# Patient Record
Sex: Male | Born: 1980 | Race: Black or African American | Hispanic: No | Marital: Single | Smoking: Current every day smoker
Health system: Southern US, Community
[De-identification: ages and names within clinical notes are randomized; demographics above are authoritative.]

## PROBLEM LIST (undated history)

## (undated) HISTORY — PX: OTHER SURGICAL HISTORY: SHX169

---

## 2005-09-25 ENCOUNTER — Emergency Department: Payer: Self-pay | Admitting: Emergency Medicine

## 2008-07-09 ENCOUNTER — Emergency Department: Payer: Self-pay | Admitting: Emergency Medicine

## 2011-10-24 ENCOUNTER — Emergency Department: Payer: Self-pay | Admitting: Emergency Medicine

## 2011-11-18 ENCOUNTER — Emergency Department: Payer: Self-pay | Admitting: Emergency Medicine

## 2013-03-22 ENCOUNTER — Emergency Department: Payer: Self-pay | Admitting: Emergency Medicine

## 2013-06-30 ENCOUNTER — Emergency Department: Payer: Self-pay | Admitting: Emergency Medicine

## 2013-07-11 ENCOUNTER — Emergency Department: Payer: Self-pay | Admitting: Emergency Medicine
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
## 2020-01-20 ENCOUNTER — Emergency Department
Admission: EM | Admit: 2020-01-20 | Discharge: 2020-01-20 | Disposition: A | Discharge status: Home / Self Care | Payer: Self-pay | Attending: Emergency Medicine | Admitting: Emergency Medicine

## 2020-01-20 ENCOUNTER — Encounter: Payer: Self-pay | Admitting: Emergency Medicine

## 2020-01-20 ENCOUNTER — Other Ambulatory Visit: Payer: Self-pay

## 2020-01-20 DIAGNOSIS — F17210 Nicotine dependence, cigarettes, uncomplicated: Secondary | ICD-10-CM | POA: Insufficient documentation

## 2020-01-20 DIAGNOSIS — M7702 Medial epicondylitis, left elbow: Secondary | ICD-10-CM | POA: Insufficient documentation

## 2020-01-20 MED ORDER — PREDNISONE 10 MG (21) PO TBPK: ORAL_TABLET | ORAL | 0 refills | Status: DC

## 2020-01-20 NOTE — Discharge Instructions (Addendum)
Take the steroids as instructed.  Follow-up with orthopedics if not improving with this medication.  If you had relief with steroids, please use ibuprofen when the area flares.  Return as needed.

## 2020-01-20 NOTE — ED Triage Notes (Signed)
Pt presents to ED via POV with c/o L shoulder pain x 2-3 weeks. Pt denies known injury at this time. 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

## 2020-01-20 NOTE — ED Notes (Signed)
See triage note- pt here with L sided shoulder pain x2-3 weeks. Pt denies injury. Pain unrelieved with rest.

## 2020-01-20 NOTE — ED Provider Notes (Signed)
[facility] Emergency Department Provider Note  ____________________________________________   First MD Initiated Contact with Patient 01/20/20 1240     (approximate)  I have reviewed the triage vital signs and the nursing notes.   HISTORY  Chief Complaint Shoulder Pain    HPI Tony Rosales is a 39 y.o. male presents emergency department complaining of left shoulder and elbow pain for 2 to 3 weeks.  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  States sometimes his grip gets weakened.  Took some Tylenol which did help with the pain but the symptoms returned after the medication wore off.  He denies any fever or chills.   No known injury   History reviewed. No pertinent past medical history.  There are no problems to display for this patient.   Past Surgical History:  Procedure Laterality Date  . Arm Surgery Right     Prior to Admission medications   Medication Sig Start Date End Date Taking? Authorizing Provider  predniSONE (STERAPRED UNI-PAK 21 TAB) 10 MG (21) TBPK tablet Take 6 pills on day one then decrease by 1 pill each day 01/20/20   Faythe Ghee, PA-C    Allergies Patient has no known allergies.  No family history on file.  Social History Social History   Tobacco Use  . Smoking status: Current Every Day Smoker    Types: Cigarettes  . Smokeless tobacco: Never Used  Substance Use Topics  . Alcohol use: Yes    Comment: Socially  . Drug use: Not Currently    Review of Systems  Constitutional: No fever/chills Eyes: No visual changes. ENT: No sore throat. Respiratory: Denies cough Genitourinary: Negative for dysuria. Musculoskeletal: Negative for back pain.  Positive for left shoulder/elbow pain Skin: Negative for rash. Psychiatric: no mood changes,     ____________________________________________   PHYSICAL EXAM:  VITAL  SIGNS: ED Triage Vitals  Enc Vitals Group     BP 01/20/20 1214 117/70     Pulse Rate 01/20/20 1214 77     Resp 01/20/20 1214 16     Temp 01/20/20 1214 98.3 F (36.8 C)     Temp Source 01/20/20 1214 Oral     SpO2 01/20/20 1214 99 %     Weight 01/20/20 1215 195 lb (88.5 kg)     Height 01/20/20 1215 5\' 11"  (1.803 m)     Head Circumference --      Peak Flow --      Pain Score 01/20/20 1223 6     Pain Loc --      Pain Edu? --      Excl. in GC? --     Constitutional: Alert and oriented. Well appearing and in no acute distress. Eyes: Conjunctivae are normal.  Head: Atraumatic. Nose: No congestion/rhinnorhea. Mouth/Throat: Mucous membranes are moist.   Neck:  supple no lymphadenopathy noted Cardiovascular: Normal rate, regular rhythm. Respiratory: Normal respiratory effort.  No retractions,  GU: deferred Musculoskeletal: FROM all extremities, warm and well perfused, area along the ulnar nerve is very tender to palpation, grips are equal bilaterally, C-spine and left shoulder are nontender.  The bicep muscle is slightly tender. Neurologic:  Normal speech and language.  Skin:  Skin is warm, dry and intact. No rash noted. Psychiatric: Mood and affect are normal. Speech and behavior are normal.  ____________________________________________   LABS (all labs ordered are listed, but only abnormal  results are displayed)  Labs Reviewed - No data to display ____________________________________________   ____________________________________________  RADIOLOGY    ____________________________________________   PROCEDURES  Procedure(s) performed: No  Procedures    ____________________________________________   INITIAL IMPRESSION / ASSESSMENT AND PLAN / ED COURSE  Pertinent labs & imaging results that were available during my care of the patient were reviewed by me and considered in my medical decision making (see chart for details).   Patient is 39 year old male presents  emergency department with left elbow pain with concerns of an overuse type injury.  See HPI.  Physical exam shows no purulent noted to be tender.  Explained findings to the patient.  Told him this is the medial epicondylitis.  However the ulnar nerve could become trapped and this could cause additional problems.  He was started on a steroid pack.  Follow-up with orthopedics.  Return if worsening.  Discharged in stable condition.     Tony Rosales was evaluated in Emergency Department on 01/20/2020 for the symptoms described in the history of present illness. He was evaluated in the context of the global COVID-19 pandemic, which necessitated consideration that the patient might be at risk for infection with the SARS-CoV-2 virus that causes COVID-19. Institutional protocols and algorithms that pertain to the evaluation of patients at risk for COVID-19 are in a state of rapid change based on information released by regulatory bodies including the CDC and federal and state organizations. These policies and algorithms were followed during the patient's care in the ED.    As part of my medical decision making, I reviewed the following data within the electronic MEDICAL RECORD NUMBER Nursing notes reviewed and incorporated, Old chart reviewed, Notes from prior ED visits and [place] Controlled Substance Database  ____________________________________________   FINAL CLINICAL IMPRESSION(S) / ED DIAGNOSES  Final diagnoses:  Medial epicondylitis of left elbow      NEW MEDICATIONS STARTED DURING THIS VISIT:  New Prescriptions   PREDNISONE (STERAPRED UNI-PAK 21 TAB) 10 MG (21) TBPK TABLET    Take 6 pills on day one then decrease by 1 pill each day     Note:  This document was prepared using Dragon voice recognition software and may include unintentional dictation errors.    Faythe Ghee, PA-C 01/20/20 1358    Shaune Pollack, MD 01/20/20 [phone]

## 2023-02-08 ENCOUNTER — Ambulatory Visit
Admission: EM | Admit: 2023-02-08 | Discharge: 2023-02-08 | Disposition: A | Discharge status: Home / Self Care | Payer: Self-pay | Attending: Physician Assistant | Admitting: Physician Assistant

## 2023-02-08 ENCOUNTER — Encounter: Payer: Self-pay | Admitting: *Deleted

## 2023-02-08 DIAGNOSIS — M545 Low back pain, unspecified: Secondary | ICD-10-CM

## 2023-02-08 DIAGNOSIS — R310 Gross hematuria: Secondary | ICD-10-CM

## 2023-02-08 LAB — URINALYSIS, W/ REFLEX TO CULTURE (INFECTION SUSPECTED)
Bilirubin Urine: NEGATIVE
Glucose, UA: NEGATIVE mg/dL
Hgb urine dipstick: NEGATIVE
Ketones, ur: NEGATIVE mg/dL
Leukocytes,Ua: NEGATIVE
Nitrite: NEGATIVE
Protein, ur: NEGATIVE mg/dL
Specific Gravity, Urine: 1.025 (ref 1.005–1.030)
pH: 7 (ref 5.0–8.0)

## 2023-02-08 MED ORDER — NAPROXEN 500 MG PO TABS: 500.0000 mg | ORAL_TABLET | Freq: Two times a day (BID) | ORAL | 0 refills | Status: AC

## 2023-02-08 MED ORDER — BACLOFEN 10 MG PO TABS: 10.0000 mg | ORAL_TABLET | Freq: Three times a day (TID) | ORAL | 0 refills | Status: AC | PRN

## 2023-02-08 MED ORDER — KETOROLAC TROMETHAMINE 60 MG/2ML IM SOLN
30.0000 mg | Freq: Once | INTRAMUSCULAR | Status: AC
  Administered 2023-02-08: 30 mg via INTRAMUSCULAR

## 2023-02-08 NOTE — ED Triage Notes (Signed)
Patient states lower back pain x2 weeks, less intense but still there.  Had blood in urine this morning, no abnormal discharge or dysuria.

## 2023-02-08 NOTE — ED Provider Notes (Signed)
[facility]    CSN: [id number] Arrival date & time: 02/08/23  1915      History   Chief Complaint Chief Complaint  Patient presents with   Back Pain    HPI Tony Rosales is a 42 y.o. male presenting for bilateral lower back pain x 2 weeks.  Denies injury.  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  Pain worse with prolonged standing and sitting.  Gets better when he moves around.  Pain does not radiate to lower extremities or abdominal region.  Denies numbness, tingling, weakness.  Has taken ibuprofen and Tylenol with temporary relief.  Also reports concern for noticing a small drop of blood in the urine today.  Denies dysuria, frequency, urgency, history of kidney stones.  No concern for STIs.  HPI  History reviewed. No pertinent past medical history.  There are no problems to display for this patient.   Past Surgical History:  Procedure Laterality Date   Arm Surgery Right        Home Medications    Prior to Admission medications   Medication Sig Start Date End Date Taking? Authorizing Provider  baclofen (LIORESAL) 10 MG tablet Take 1 tablet (10 mg total) by mouth 3 (three) times daily as needed for muscle spasms. 02/08/23  Yes Eusebio Friendly B, PA-C  naproxen (NAPROSYN) 500 MG tablet Take 1 tablet (500 mg total) by mouth 2 (two) times daily. 02/08/23  Yes Shirlee Latch PA-C    Family History History reviewed. No pertinent family history.  Social History Social History   Tobacco Use   Smoking status: Every Day    Types: Cigarettes   Smokeless tobacco: Never  Vaping Use   Vaping status: Never Used  Substance Use Topics   Alcohol use: Yes    Alcohol/week: 7.0 standard drinks of alcohol    Types: 7 Shots of liquor per week   Drug use: Not Currently     Allergies   Patient has no known allergies.   Review of Systems Review of Systems  Constitutional:  Negative for fatigue and fever.  Gastrointestinal:  Negative for abdominal  pain, nausea and vomiting.  Genitourinary:  Positive for hematuria. Negative for dysuria, frequency, genital sores, penile discharge, penile pain, penile swelling, scrotal swelling, testicular pain and urgency.  Musculoskeletal:  Positive for back pain. Negative for arthralgias.  Skin:  Negative for rash.  Neurological:  Negative for weakness.     Physical Exam Triage Vital Signs ED Triage Vitals  Encounter Vitals Group     BP 02/08/23 1958 (!) 144/89     Systolic BP Percentile --      Diastolic BP Percentile --      Pulse Rate 02/08/23 1958 70     Resp 02/08/23 1958 16     Temp 02/08/23 1958 98.7 F (37.1 C)     Temp Source 02/08/23 1958 Oral     SpO2 02/08/23 1958 96 %     Weight 02/08/23 1957 200 lb (90.7 kg)     Height 02/08/23 1957 5\' 11"  (1.803 m)     Head Circumference --      Peak Flow --      Pain Score 02/08/23 1956 5     Pain Loc --      Pain Education --      Exclude from Growth Chart --    No data found.  Updated Vital Signs BP (!) 144/89 (BP Location: Right Arm)   Pulse 70  Temp 98.7 F (37.1 C) (Oral)   Resp 16   Ht 5\' 11"  (1.803 m)   Wt 200 lb (90.7 kg)   SpO2 96%   BMI 27.89 kg/m      Physical Exam Vitals and nursing note reviewed.  Constitutional:      General: He is not in acute distress.    Appearance: Normal appearance. He is well-developed. He is not ill-appearing.  HENT:     Head: Normocephalic and atraumatic.  Eyes:     General: No scleral icterus.    Conjunctiva/sclera: Conjunctivae normal.  Cardiovascular:     Rate and Rhythm: Normal rate and regular rhythm.     Heart sounds: Normal heart sounds.  Pulmonary:     Effort: Pulmonary effort is normal. No respiratory distress.     Breath sounds: Normal breath sounds.  Abdominal:     Palpations: Abdomen is soft.     Tenderness: There is no abdominal tenderness. There is no right CVA tenderness or left CVA tenderness.  Musculoskeletal:     Cervical back: Neck supple.  Skin:     General: Skin is warm and dry.     Capillary Refill: Capillary refill takes less than 2 seconds.  Neurological:     General: No focal deficit present.     Mental Status: He is alert. Mental status is at baseline.     Motor: No weakness.     Gait: Gait normal.  Psychiatric:        Mood and Affect: Mood normal.        Behavior: Behavior normal.      UC Treatments / Results  Labs (all labs ordered are listed, but only abnormal results are displayed) Labs Reviewed  URINALYSIS, W/ REFLEX TO CULTURE (INFECTION SUSPECTED) - Abnormal; Notable for the following components:      Result Value   Bacteria, UA FEW (*)    All other components within normal limits    EKG   Radiology No results found.  Procedures Procedures (including critical care time)  Medications Ordered in UC Medications - No data to display  Initial Impression / Assessment and Plan / UC Course  I have reviewed the triage vital signs and the nursing notes.  Pertinent labs & imaging results that were available during my care of the patient were reviewed by me and considered in my medical decision making (see chart for details).    exam *** Final Clinical Impressions(s) / UC Diagnoses   Final diagnoses:  Acute bilateral low back pain without sciatica  Gross hematuria     Discharge Instructions      BACK PAIN: Stressed avoiding painful activities . RICE (REST, ICE, COMPRESSION, ELEVATION) guidelines reviewed. May alternate ice and heat. Consider use of muscle rubs, Salonpas patches, etc. Use medications as directed including muscle relaxers if prescribed. Take anti-inflammatory medications as prescribed or OTC NSAIDs/Tylenol.  F/u with PCP in 7-10 days for reexamination, and please feel free to call or return to the urgent care at any time for any questions or concerns you may have and we will be happy to help you!   BACK PAIN RED FLAGS: If the back pain acutely worsens or there are any red flag symptoms  such as numbness/tingling, leg weakness, saddle anesthesia, or loss of bowel/bladder control, go immediately to the ER. Follow up with [place] as scheduled or sooner if the pain does not begin to resolve or if it worsens before the follow up    If you  have more blood in urine, follow up with PCP for further eval or go to ER.     ED Prescriptions     Medication Sig Dispense Auth. Provider   naproxen (NAPROSYN) 500 MG tablet Take 1 tablet (500 mg total) by mouth 2 (two) times daily. 30 tablet Eusebio Friendly B, PA-C   baclofen (LIORESAL) 10 MG tablet Take 1 tablet (10 mg total) by mouth 3 (three) times daily as needed for muscle spasms. 30 each Gareth Morgan      PDMP not reviewed this encounter.

## 2023-02-08 NOTE — Discharge Instructions (Signed)
BACK PAIN: Stressed avoiding painful activities . RICE (REST, ICE, COMPRESSION, ELEVATION) guidelines reviewed. May alternate ice and heat. Consider use of muscle rubs, Salonpas patches, etc. Use medications as directed including muscle relaxers if prescribed. Take anti-inflammatory medications as prescribed or OTC NSAIDs/Tylenol.  F/u with PCP in 7-10 days for reexamination, and please feel free to call or return to the urgent care at any time for any questions or concerns you may have and we will be happy to help you!   BACK PAIN RED FLAGS: If the back pain acutely worsens or there are any red flag symptoms such as numbness/tingling, leg weakness, saddle anesthesia, or loss of bowel/bladder control, go immediately to the ER. Follow up with [place] as scheduled or sooner if the pain does not begin to resolve or if it worsens before the follow up    If you have more blood in urine, follow up with PCP for further eval or go to ER.

## 2023-04-18 ENCOUNTER — Encounter: Payer: Self-pay | Admitting: Nurse Practitioner

## 2023-04-18 ENCOUNTER — Ambulatory Visit: Payer: Self-pay | Admitting: Nurse Practitioner

## 2023-04-18 DIAGNOSIS — Z202 Contact with and (suspected) exposure to infections with a predominantly sexual mode of transmission: Secondary | ICD-10-CM

## 2023-04-18 DIAGNOSIS — Z113 Encounter for screening for infections with a predominantly sexual mode of transmission: Secondary | ICD-10-CM

## 2023-04-18 LAB — HM HIV SCREENING LAB: HM HIV Screening: NEGATIVE

## 2023-04-18 LAB — HM HEPATITIS C SCREENING LAB: HM Hepatitis Screen: NEGATIVE

## 2023-04-18 LAB — HEPATITIS B SURFACE ANTIGEN

## 2023-04-18 MED ORDER — METRONIDAZOLE 500 MG PO TABS: 2000.0000 mg | ORAL_TABLET | Freq: Once | ORAL | Status: AC

## 2023-04-18 NOTE — Progress Notes (Signed)
 [facility] Department STI clinic [address] [place] [address] Main phone: [phone]  STI screening visit  Subjective:  Tony Rosales is a 43 y.o. male being seen today for an STI screening visit. The patient reports they do not have symptoms.    Patient has the following medical conditions:  There are no active problems to display for this patient.   Chief Complaint  Patient presents with  . SEXUALLY TRANSMITTED DISEASE    STI screening-no symptoms- contact to [facility]    Patient is a pleasant 43 y.o. male who presents today for STI testing as a contact. Patient reports he is asymptomatic and his partner notified him that 1 week ago she was positive for trich and is getting treatment beginning today. Patient has male partners and practices penile penetrative and oral sex. He reports no history of STI.   Last HIV test per patient/review of record was No results found for: HMHIVSCREEN No results found for: HIV  Last HEPC test per patient/review of record was No results found for: HMHEPCSCREEN No components found for: HEPC   Last HEPB test per patient/review of record was No components found for: HMHEPBSCREEN No components found for: HEPC   Does the patient or their partner desires a pregnancy in the next year? No  Screening for MPX risk: Does the patient have an unexplained rash? No Is the patient MSM? No Does the patient endorse multiple sex partners or anonymous sex partners? Yes Did the patient have close or sexual contact with a person diagnosed with MPX? No Has the patient traveled outside the [place]  where MPX is endemic? No Is there a high clinical suspicion for MPX-- evidenced by one of the following No  -Unlikely to be chickenpox  -Lymphadenopathy  -Rash that present in same phase of evolution on any given body part   See flowsheet for further details and programmatic requirements.    There is no immunization  history on file for this patient.   The following portions of the patient's history were reviewed and updated as appropriate: allergies, current medications, past medical history, past social history, past surgical history and problem list.  Objective:  There were no vitals filed for this visit.  Physical Exam Nursing note reviewed.  Constitutional:      Appearance: Normal appearance.  HENT:     Head: Normocephalic.     Salivary Glands: Right salivary gland is not diffusely enlarged or tender. Left salivary gland is not diffusely enlarged or tender.     Mouth/Throat:     Lips: Pink. No lesions.     Mouth: Mucous membranes are moist. No oral lesions.     Tongue: No lesions. Tongue does not deviate from midline.     Pharynx: Oropharynx is clear. Uvula midline. No oropharyngeal exudate or posterior oropharyngeal erythema.     Tonsils: No tonsillar exudate.  Eyes:     General:        Right eye: No discharge.        Left eye: No discharge.     Conjunctiva/sclera:     Right eye: Right conjunctiva is not injected. No exudate.    Left eye: Left conjunctiva is not injected. No exudate. Pulmonary:     Effort: Pulmonary effort is normal.  Lymphadenopathy:     Head:     Right side of head: No submental, submandibular, tonsillar, preauricular or posterior auricular adenopathy.     Left side of head: No submental, submandibular, tonsillar,  preauricular or posterior auricular adenopathy.     Cervical: No cervical adenopathy.     Right cervical: No superficial or posterior cervical adenopathy.    Left cervical: No superficial or posterior cervical adenopathy.     Upper Body:     Right upper body: No supraclavicular or axillary adenopathy.     Left upper body: No supraclavicular or axillary adenopathy.  Skin:    General: Skin is warm and dry.     Findings: No rash.     Comments: Skin tone appropriate for ethnicity. Exposed areas only.   Neurological:     Mental Status: He is alert and  oriented to person, place, and time.  Psychiatric:        Attention and Perception: Attention normal.        Mood and Affect: Mood and affect normal.        Speech: Speech normal.        Behavior: Behavior normal. Behavior is cooperative.        Thought Content: Thought content normal.    Assessment and Plan:  Tony Rosales is a 43 y.o. male presenting to the [facility] Department for STI screening  1. Screening for venereal disease (Primary)  - HIV/HCV [facility] - HBV Antigen/Antibody [facility] - Syphilis Serology, [place] [facility] - Gonococcus culture - Chlamydia/GC NAA, Confirmation  2. Trichomonas contact  - metroNIDAZOLE  (FLAGYL ) 500 MG tablet; Take 4 tablets (2,000 mg total) by mouth once for 1 dose.  Patient does not have STI symptoms Patient accepted all screenings including  urine GC/Chlamydia, and blood work for HIV/Syphilis. Patient meets criteria for HepB screening? Yes. Ordered? yes Patient meets criteria for HepC screening? Yes. Ordered? yes Recommended condom use with all sex Discussed importance of condom use for STI prevention  Treat positive test results per standing order. Discussed time line for State Lab results and that patient will be called with positive results and encouraged patient to call if he had not heard in 2 weeks Recommended repeat testing in 3 months with positive results. Recommended returning for continued or worsening symptoms.   Return if symptoms worsen or fail to improve.  No future appointments.  Total time with patient 30 minutes.   Clarita LITTIE Narrow, NP

## 2023-04-18 NOTE — Progress Notes (Signed)
 Pt here for STI screening and for treatment as contact to [facility].  No in house labs performed.  Metronidazole  500mg  #4 tablets dispensed with patient.  Pt counseled on medication, side effects, plan of care and when to contact clinic with questions or concerns.  Verbalizes understanding.  Condoms declined.-Laveda Demedeiros, RN

## 2023-04-19 ENCOUNTER — Ambulatory Visit
Admission: EM | Admit: 2023-04-19 | Discharge: 2023-04-19 | Disposition: A | Discharge status: Home / Self Care | Payer: Self-pay | Attending: Family Medicine | Admitting: Family Medicine

## 2023-04-19 DIAGNOSIS — Z202 Contact with and (suspected) exposure to infections with a predominantly sexual mode of transmission: Secondary | ICD-10-CM | POA: Insufficient documentation

## 2023-04-19 NOTE — Discharge Instructions (Addendum)
 Your STD test results will be available in the next 72 hours. If positive, someone will contact you.  You should see your results in your MyChart account.   Consider taking the metronidazole  prescribed by the health department as men tend to not have symptoms with trichomonas. Do not drink alcohol for 24 hours after taking this medication.

## 2023-04-19 NOTE — ED Triage Notes (Addendum)
 Patient states that his partner told him that she was positive for trich. Wants testing. [facility] to [facility] yesterday and was tested for G&C and did blood work. Patient states that they gave him meds but he hasn't stared taking the medication because he wants to know if he's positive.

## 2023-04-21 LAB — CYTOLOGY, (ORAL, ANAL, URETHRAL) ANCILLARY ONLY
Chlamydia: NEGATIVE
Comment: NEGATIVE
Comment: NEGATIVE
Comment: NORMAL
Neisseria Gonorrhea: NEGATIVE
Trichomonas: POSITIVE — AB

## 2023-04-22 LAB — GONOCOCCUS CULTURE

## 2023-04-22 LAB — CHLAMYDIA/GC NAA, CONFIRMATION
Chlamydia trachomatis, NAA: NEGATIVE
Neisseria gonorrhoeae, NAA: NEGATIVE

## 2023-04-22 NOTE — ED Provider Notes (Signed)
 [facility]    CSN: [id number] Arrival date & time: 04/19/23  1847      History   Chief Complaint Chief Complaint  Patient presents with   SEXUALLY TRANSMITTED DISEASE    HPI  HPI  Tony Rosales is a 43 y.o. male here for STD testing.  States he was exposed to trichomonas.  He denies all symptoms.  He does not use condoms regularly.  - Penile discharge : no -Testicular pain no  - Fever: no - Abdominal pain no - Rash: no - Sore throat: no   - Arthralgias: no - Nausea: no - Vomiting: no - Dysuria: no - Back Pain: no  - Headache: no       History reviewed. No pertinent past medical history.  There are no active problems to display for this patient.   Past Surgical History:  Procedure Laterality Date   Arm Surgery Right        Home Medications    Prior to Admission medications   Medication Sig Start Date End Date Taking? Authorizing Provider  baclofen  (LIORESAL ) 10 MG tablet Take 1 tablet (10 mg total) by mouth 3 (three) times daily as needed for muscle spasms. 02/08/23   Arvis Jolan NOVAK, PA-C  naproxen  (NAPROSYN ) 500 MG tablet Take 1 tablet (500 mg total) by mouth 2 (two) times daily. 02/08/23   Arvis Jolan NOVAK, PA-C    Family History History reviewed. No pertinent family history.  Social History Social History   Tobacco Use   Smoking status: Every Day    Current packs/day: 0.50    Types: Cigarettes   Smokeless tobacco: Never  Vaping Use   Vaping status: Never Used  Substance Use Topics   Alcohol use: Yes    Alcohol/week: 2.0 standard drinks of alcohol    Types: 2 Shots of liquor per week   Drug use: Yes    Frequency: 7.0 times per week    Types: Marijuana    Comment: Daily     Allergies   Patient has no known allergies.   Review of Systems Review of Systems: negative unless otherwise stated in HPI.      Physical Exam Triage Vital Signs ED Triage Vitals  Encounter Vitals Group     BP 04/19/23 1936 (!) 137/101      Systolic BP Percentile --      Diastolic BP Percentile --      Pulse Rate 04/19/23 1936 63     Resp 04/19/23 1936 18     Temp 04/19/23 1936 99 F (37.2 C)     Temp Source 04/19/23 1936 Oral     SpO2 04/19/23 1936 97 %     Weight --      Height --      Head Circumference --      Peak Flow --      Pain Score 04/19/23 1932 0     Pain Loc --      Pain Education --      Exclude from Growth Chart --    No data found.  Updated Vital Signs BP (!) 137/92 (BP Location: Left Arm)   Pulse 63   Temp 99 F (37.2 C) (Oral)   Resp 18   SpO2 97%   Visual Acuity Right Eye Distance:   Left Eye Distance:   Bilateral Distance:    Right Eye Near:   Left Eye Near:    Bilateral Near:     Physical Exam GEN: well appearing  male in no acute distress  CVS: well perfused  RESP: speaking in full sentences without pause, no respiratory distress  GU: deferred, patient performed self swab    UC Treatments / Results  Labs (all labs ordered are listed, but only abnormal results are displayed) Labs Reviewed  CYTOLOGY, (ORAL, ANAL, URETHRAL) ANCILLARY ONLY - Abnormal; Notable for the following components:      Result Value   Trichomonas Positive (*)    All other components within normal limits    EKG   Radiology No results found.  Procedures Procedures (including critical care time)  Medications Ordered in UC Medications - No data to display  Initial Impression / Assessment and Plan / UC Course  I have reviewed the triage vital signs and the nursing notes.  Pertinent labs & imaging results that were available during my care of the patient were reviewed by me and considered in my medical decision making (see chart for details).     Patient is a 43 y.o.. male who presents for STD testing.  Overall patient is well-appearing and well-hydrated.  Vital signs are stable.  On chart review, he [facility]D [facility].  States that he did not test for  trichomonas and just treated him.  He does not want to take this medication without knowing he has trichomonas.  Explained to patient that sometimes men do not have any symptoms at all.  Recommended he take this medication but he declines for now.  Request STD testing that includes trichomonas.  Gonorrhea, chlamydia and trichomonas swab collected.  Advised patient that if he does take his metronidazole  that was prescribed by the [facility] department not to drink any alcohol as he could have an adverse reaction to the medication.  He notes that he does drink 2 shots of liquor daily.  Patient voiced understanding about vomiting and metronidazole  with alcohol.  He reports he will hold off taking this medication for now.  HIV and syphilis testing was performed at the [facility] at the health department.  Advised him to use condoms with every sexual encounter.  Safe sex provided.  Discussed MDM, treatment plan and plan for follow-up with patient who agrees with plan.     Final Clinical Impressions(s) / UC Diagnoses   Final diagnoses:  Exposure to sexually transmitted disease (STD)  Trichomonas contact     Discharge Instructions       Your STD test results will be available in the next 72 hours. If positive, someone will contact you.  You should see your results in your MyChart account.   Consider taking the metronidazole  prescribed by the health department as men tend to not have symptoms with trichomonas. Do not drink alcohol for 24 hours after taking this medication.     ED Prescriptions   None    PDMP not reviewed this encounter.   Dominion Kathan, DO 04/22/23 1835
